# Patient Record
Sex: Male | Born: 1990 | Hispanic: No | Marital: Single | Smoking: Never smoker
Health system: Southern US, Community
[De-identification: ages and names within clinical notes are randomized; demographics above are authoritative.]

## PROBLEM LIST (undated history)

## (undated) DIAGNOSIS — J45909 Unspecified asthma, uncomplicated: Secondary | ICD-10-CM

---
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
## 2009-05-09 ENCOUNTER — Ambulatory Visit: Payer: Self-pay | Admitting: Physician Assistant

## 2009-05-09 DIAGNOSIS — F1010 Alcohol abuse, uncomplicated: Secondary | ICD-10-CM | POA: Insufficient documentation

## 2009-05-09 DIAGNOSIS — J45909 Unspecified asthma, uncomplicated: Secondary | ICD-10-CM | POA: Insufficient documentation

## 2009-05-09 DIAGNOSIS — S8000XA Contusion of unspecified knee, initial encounter: Secondary | ICD-10-CM | POA: Insufficient documentation

## 2009-05-09 DIAGNOSIS — J309 Allergic rhinitis, unspecified: Secondary | ICD-10-CM | POA: Insufficient documentation

## 2009-05-09 DIAGNOSIS — B354 Tinea corporis: Secondary | ICD-10-CM | POA: Insufficient documentation

## 2009-12-31 ENCOUNTER — Emergency Department (HOSPITAL_COMMUNITY)
Admission: EM | Admit: 2009-12-31 | Discharge: 2009-12-31 | Payer: Self-pay | Source: Home / Self Care | Admitting: Family Medicine
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
## 2010-10-01 LAB — HERPES SIMPLEX VIRUS CULTURE: Culture: NOT DETECTED

## 2013-04-08 ENCOUNTER — Emergency Department (HOSPITAL_COMMUNITY)
Admission: EM | Admit: 2013-04-08 | Discharge: 2013-04-08 | Disposition: A | Discharge status: Home / Self Care | Payer: Self-pay | Attending: Emergency Medicine | Admitting: Emergency Medicine

## 2013-04-08 ENCOUNTER — Encounter (HOSPITAL_COMMUNITY): Payer: Self-pay | Admitting: Emergency Medicine

## 2013-04-08 ENCOUNTER — Emergency Department (HOSPITAL_COMMUNITY): Payer: Self-pay

## 2013-04-08 DIAGNOSIS — S0083XA Contusion of other part of head, initial encounter: Secondary | ICD-10-CM

## 2013-04-08 DIAGNOSIS — F3289 Other specified depressive episodes: Secondary | ICD-10-CM | POA: Insufficient documentation

## 2013-04-08 DIAGNOSIS — F32A Depression, unspecified: Secondary | ICD-10-CM

## 2013-04-08 DIAGNOSIS — J45909 Unspecified asthma, uncomplicated: Secondary | ICD-10-CM | POA: Insufficient documentation

## 2013-04-08 DIAGNOSIS — F1010 Alcohol abuse, uncomplicated: Secondary | ICD-10-CM | POA: Insufficient documentation

## 2013-04-08 DIAGNOSIS — S60229A Contusion of unspecified hand, initial encounter: Secondary | ICD-10-CM | POA: Insufficient documentation

## 2013-04-08 DIAGNOSIS — S1093XA Contusion of unspecified part of neck, initial encounter: Secondary | ICD-10-CM

## 2013-04-08 DIAGNOSIS — F329 Major depressive disorder, single episode, unspecified: Secondary | ICD-10-CM | POA: Insufficient documentation

## 2013-04-08 DIAGNOSIS — S0003XA Contusion of scalp, initial encounter: Secondary | ICD-10-CM | POA: Insufficient documentation

## 2013-04-08 DIAGNOSIS — X838XXA Intentional self-harm by other specified means, initial encounter: Secondary | ICD-10-CM | POA: Insufficient documentation

## 2013-04-08 HISTORY — DX: Unspecified asthma, uncomplicated: J45.909

## 2013-04-08 LAB — COMPREHENSIVE METABOLIC PANEL
ALBUMIN: 4.5 g/dL (ref 3.5–5.2)
ALK PHOS: 92 U/L (ref 39–117)
ALT: 23 U/L (ref 0–53)
AST: 30 U/L (ref 0–37)
BUN: 9 mg/dL (ref 6–23)
CALCIUM: 8.7 mg/dL (ref 8.4–10.5)
CO2: 22 mEq/L (ref 19–32)
Chloride: 104 mEq/L (ref 96–112)
Creatinine, Ser: 0.71 mg/dL (ref 0.50–1.35)
GFR calc non Af Amer: >90 mL/min (ref >90)
GLUCOSE: 102 mg/dL — AB (ref 70–99)
POTASSIUM: 3.9 meq/L (ref 3.7–5.3)
SODIUM: 143 meq/L (ref 137–147)
TOTAL PROTEIN: 8.1 g/dL (ref 6.0–8.3)
Total Bilirubin: 0.4 mg/dL (ref 0.3–1.2)

## 2013-04-08 LAB — RAPID URINE DRUG SCREEN, HOSP PERFORMED
AMPHETAMINES: NOT DETECTED
BENZODIAZEPINES: NOT DETECTED
Barbiturates: NOT DETECTED
COCAINE: NOT DETECTED
OPIATES: NOT DETECTED
TETRAHYDROCANNABINOL: NOT DETECTED

## 2013-04-08 LAB — CBC WITH DIFFERENTIAL/PLATELET
BASOS PCT: 1 % (ref 0–1)
Basophils Absolute: 0 10*3/uL (ref 0.0–0.1)
EOS ABS: 0 10*3/uL (ref 0.0–0.7)
EOS PCT: 1 % (ref 0–5)
HCT: 44.3 % (ref 39.0–52.0)
Hemoglobin: 16.4 g/dL (ref 13.0–17.0)
Lymphocytes Relative: 51 % — ABNORMAL HIGH (ref 12–46)
Lymphs Abs: 1.5 10*3/uL (ref 0.7–4.0)
MCH: 33.1 pg (ref 26.0–34.0)
MCHC: 37 g/dL — AB (ref 30.0–36.0)
MCV: 89.5 fL (ref 78.0–100.0)
Monocytes Absolute: 0.2 10*3/uL (ref 0.1–1.0)
Monocytes Relative: 6 % (ref 3–12)
NEUTROS PCT: 41 % — AB (ref 43–77)
Neutro Abs: 1.2 10*3/uL — ABNORMAL LOW (ref 1.7–7.7)
PLATELETS: 276 10*3/uL (ref 150–400)
RBC: 4.95 MIL/uL (ref 4.22–5.81)
RDW: 11.8 % (ref 11.5–15.5)
WBC: 3 10*3/uL — ABNORMAL LOW (ref 4.0–10.5)

## 2013-04-08 LAB — ETHANOL: ALCOHOL ETHYL (B): 242 mg/dL — AB (ref 0–11)
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
## 2013-04-08 NOTE — ED Notes (Signed)
Pt tried to give urine sample and was unable to give same.  Will try again at later time.

## 2013-04-08 NOTE — Discharge Instructions (Signed)
Alcohol Intoxication Alcohol intoxication occurs when you drink enough alcohol that it affects your ability to function. It can be mild or very severe. Drinking a lot of alcohol in a short time is called binge drinking. This can be very harmful. Drinking alcohol can also be more dangerous if you are taking medicines or other drugs. Some of the effects caused by alcohol may include:  Loss of coordination.  Changes in mood and behavior.  Unclear thinking.  Trouble talking (slurred speech).  Throwing up (vomiting).  Confusion.  Slowed breathing.  Twitching and shaking (seizures).  Loss of consciousness. HOME CARE  Do not drive after drinking alcohol.  Drink enough water and fluids to keep your pee (urine) clear or pale yellow. Avoid caffeine.  Only take medicine as told by your doctor. GET HELP IF:  You throw up (vomit) many times.  You do not feel better after a few days.  You frequently have alcohol intoxication. Your doctor can help decide if you should see a substance use treatment counselor. GET HELP RIGHT AWAY IF:  You become shaky when you stop drinking.  You have twitching and shaking.  You throw up blood. It may look bright red or like coffee grounds.  You notice blood in your poop (bowel movements).  You become lightheaded or pass out (faint). MAKE SURE YOU:   Understand these instructions.  Will watch your condition.  Will get help right away if you are not doing well or get worse. Document Released: 06/09/2007 Document Revised: 08/23/2012 Document Reviewed: 05/26/2012 [facility] Patient Information 2014 [place].  Alcohol Problems Most adults who drink alcohol drink in moderation (not a lot) are at low risk for developing problems related to their drinking. However, all drinkers, including low-risk drinkers, should know about the health risks connected with drinking alcohol. RECOMMENDATIONS FOR LOW-RISK DRINKING  Drink in moderation. Moderate  drinking is defined as follows:   Men - no more than 2 drinks per day.  Nonpregnant women - no more than 1 drink per day.  Over age 53 - no more than 1 drink per day. A standard drink is 12 grams of pure alcohol, which is equal to a 12 ounce bottle of beer or wine cooler, a 5 ounce glass of wine, or 1.5 ounces of distilled spirits (such as whiskey, brandy, vodka, or rum).  ABSTAIN FROM (DO NOT DRINK) ALCOHOL:  When pregnant or considering pregnancy.  When taking a medication that interacts with alcohol.  If you are alcohol dependent.  A medical condition that prohibits drinking alcohol (such as ulcer, liver disease, or heart disease). DISCUSS WITH YOUR CAREGIVER:  If you are at risk for coronary heart disease, discuss the potential benefits and risks of alcohol use: Light to moderate drinking is associated with lower rates of coronary heart disease in certain populations (for example, men over age 61 and postmenopausal women). Infrequent or nondrinkers are advised not to begin light to moderate drinking to reduce the risk of coronary heart disease so as to avoid creating an alcohol-related problem. Similar protective effects can likely be gained through proper diet and exercise.  Women and the elderly have smaller amounts of body water than men. As a result women and the elderly achieve a higher blood alcohol concentration after drinking the same amount of alcohol.  Exposing a fetus to alcohol can cause a broad range of birth defects referred to as Fetal Alcohol Syndrome (FAS) or Alcohol-Related Birth Defects (ARBD). Although FAS/ARBD is connected with excessive alcohol consumption  during pregnancy, studies also have reported neurobehavioral problems in infants born to mothers reporting drinking an average of 1 drink per day during pregnancy.  Heavier drinking (the consumption of more than 4 drinks per occasion by men and more than 3 drinks per occasion by women) impairs learning (cognitive)  and psychomotor functions and increases the risk of alcohol-related problems, including accidents and injuries. CAGE QUESTIONS:   Have you ever felt that you should Cut down on your drinking?  Have people Annoyed you by criticizing your drinking?  Have you ever felt bad or Guilty about your drinking?  Have you ever had a drink first thing in the morning to steady your nerves or get rid of a hangover (Eye opener)? If you answered positively to any of these questions: You may be at risk for alcohol-related problems if alcohol consumption is:   Men: Greater than 14 drinks per week or more than 4 drinks per occasion.  Women: Greater than 7 drinks per week or more than 3 drinks per occasion. Do you or your family have a medical history of alcohol-related problems, such as:  Blackouts.  Sexual dysfunction.  Depression.  Trauma.  Liver dysfunction.  Sleep disorders.  Hypertension.  Chronic abdominal pain.  Has your drinking ever caused you problems, such as problems with your family, problems with your work (or school) performance, or accidents/injuries?  Do you have a compulsion to drink or a preoccupation with drinking?  Do you have poor control or are you unable to stop drinking once you have started?  Do you have to drink to avoid withdrawal symptoms?  Do you have problems with withdrawal such as tremors, nausea, sweats, or mood disturbances?  Does it take more alcohol than in the past to get you high?  Do you feel a strong urge to drink?  Do you change your plans so that you can have a drink?  Do you ever drink in the morning to relieve the shakes or a hangover? If you have answered a number of the previous questions positively, it may be time for you to talk to your caregivers, family, and friends and see if they think you have a problem. Alcoholism is a chemical dependency that keeps getting worse and will eventually destroy your health and relationships. Many  alcoholics end up dead, impoverished, or in prison. This is often the end result of all chemical dependency.  Do not be discouraged if you are not ready to take action immediately.  Decisions to change behavior often involve up and down desires to change and feeling like you cannot decide.  Try to think more seriously about your drinking behavior.  Think of the reasons to quit. WHERE TO GO FOR ADDITIONAL INFORMATION   The National Institute on Alcohol Abuse and Alcoholism (NIAAA) BasicStudents.dkwww.niaaa.nih.gov  [employer] Council on Alcoholism and Drug Dependence (NCADD) www.ncadd.org  American Society of Addiction Medicine (ASAM) RoyalDiary.glwww.asam.org  Document Released: 12/21/2004 Document Revised: 03/15/2011 Document Reviewed: 08/09/2007 [facility] Patient Information 2014 [place]. You have signs of possible anxiety and/or depression. This is a very common problem.  Be sure to call your caregiver and arrange for follow-up care as suggested by our staff. RETURN IMMEDIATELY IF DEVELOP threat to harm self or others, suicidal or homicidal thoughts, hallucinations or confusion, unable to be cared for at home or uncontrolled behavior, or other concerns.  Emergency Department Resource Guide 1) Find a Doctor and Pay Out of Pocket Although you won't have to find out who is covered by  your insurance plan, it is a good idea to ask around and get recommendations. You will then need to call the office and see if the doctor you have chosen will accept you as a new patient and what types of options they offer for patients who are self-pay. Some doctors offer discounts or will set up payment plans for their patients who do not have insurance, but you will need to ask so you aren't surprised when you get to your appointment.  2) Contact Your Local Health Department Not all health departments have doctors that can see patients for sick visits, but many do, so it is worth a call to see if yours does. If you don't know  where your local health department is, you can check in your phone book. The CDC also has a tool to help you locate your state's health department, and many state websites also have listings of all of their local health departments.  3) Find a Walk-in Clinic If your illness is not likely to be very severe or complicated, you may want to try a walk in clinic. These are popping up all over the country in pharmacies, drugstores, and shopping centers. They're usually staffed by nurse practitioners or physician assistants that have been trained to treat common illnesses and complaints. They're usually fairly quick and inexpensive. However, if you have serious medical issues or chronic medical problems, these are probably not your best option.  No Primary Care Doctor: - Call Health Connect at  [phone] - they can help you locate a primary care doctor that  accepts your insurance, provides certain services, etc. - Physician Referral Service- [phone]  Chronic Pain Problems: Organization         Address  Phone   Notes  [facility]  [phone] Patients need to be referred by their primary care doctor.   Medication Assistance: Organization         Address  Phone   Notes  [facility] Medication [facility] [address] [place] [place], [place] [address] [phone] --Must be a resident of [facility] -- Must have NO insurance coverage whatsoever (no Medicaid/ Medicare, etc.) -- The pt. MUST have a primary care doctor that directs their care regularly and follows them in the community   [facility]  [phone]   [employer]  [phone]    Agencies that provide inexpensive medical care: Organization         Address  Phone   Notes  [facility]  [phone]   [facility]    [phone]   [facility] [address] [place], [place] [address] [phone]   [facility]  [address] [place], [place] [phone]   Planned Parenthood    [phone]   [facility]    [phone]   [facility]  [address], [place] Phone:  [phone], Fax:  [phone] Hours of Operation:  9 am - 6 pm, M-F.  Also accepts Medicaid/Medicare and self-pay.  [facility]  [address], [place] Phone: [phone], Fax: [phone]. Hours of Operation:  8:30 am - 5:30 pm, M-F.  Also accepts Medicaid and self-pay.  [facility] [place] [address] [place] Phone: [phone]   [facility] [address] Natasha Bence [place], [place] [phone], Ext. [phone] Mondays &  Thursdays: 7-9 AM.  First 15 patients are seen on a first come, first serve basis.    Medicaid-accepting [facility] Providers:  Organization         Address  Phone   Notes  [facility] [address], [place] [phone] Also accepts self-pay patients.  [facility] [address] Laurell Josephs [place], [place]  [phone]   [facility] [address], [place] [phone]   [facility] [address] [place] [phone]   Renaye Rakers [address], [place]   [phone] Only accepts [place] Access [place] patients after they have their name applied to their card.   Self-Pay (no insurance) in [facility]:  Organization         Address  Phone   Notes  Sickle Cell Patients, [facility] [address] [place], [place] [phone]   [facility] [address] [place], [place] [phone]   [facility] [place]  [address], [place] [phone]   [facility]/Dr. Osei-Bonsu  [address], [place] or [address], [place] [phone] Phone number for both  [place] and [place] locations is the same.  [facility] [address], [place] [phone]   [facility] [address], [place] or [address] [phone]   [facility] [address], [place] [phone], phone; [phone], fax Sees patients 1st and 3rd Saturday of every month.  Must not qualify for public or private insurance (i.e. Medicaid, Medicare, [place] Health Choice, Veterans' Benefits)  Household income should be no more than 200% of the poverty level The clinic cannot treat you if you are pregnant or think you are pregnant  Sexually transmitted diseases are not treated at the clinic.   Dental Care: Organization         Address  Phone  Notes  [facility] Department of [facility] [address] [place], [place] [phone] Accepts children up to age 20 who are enrolled in [place] or [place] Health Choice; pregnant women with a Medicaid card; and children who have applied for Medicaid or [place] Health Choice, but were declined, whose parents can pay a reduced fee at time of service.  [facility] Department of [facility]  [address], [place] [phone] Accepts children up to age 34 who are enrolled in [place] or [place] Health Choice; pregnant women with a Medicaid card; and children who have applied for Medicaid or [place] Health Choice, but were declined, whose parents can pay a reduced fee at time of service.  [facility]  [address] [place], [place] [phone] Patients are seen by appointment only. Walk-ins are not accepted. [facility] will see patients 48 years of age and older. Monday - Tuesday (8am-5pm) Most Wednesdays (8:30-5pm) $30 per visit, cash only  [facility] Adult Dental Access [facility]  [address], [facility] [phone] Patients are seen by appointment only. Walk-ins are not  accepted. [facility] will see patients 11 years of age and older. One Wednesday Evening (Monthly: Volunteer Based).  $30 per visit, cash only  [employer]  [phone] for adults; Children under age  4, call Graduate Pediatric Dentistry at [phone]. Children aged 89-14, please call [phone] to request a pediatric application.  Dental services are provided in all areas of dental care including fillings, crowns and bridges, complete and partial dentures, implants, gum treatment, root canals, and extractions. Preventive care is also provided. Treatment is provided to both adults and children. Patients are selected via a lottery and there is often a waiting list.   [facility] [address] [place]  [phone] www.[email or web address]   [facility] [address] [place], [place] [phone], Ext. [phone] Second and Fourth Thursday of each month, opens at 6:30 AM; Clinic ends at 9 AM.  Patients are seen on a first-come first-served basis, and a limited number are seen during each clinic.   [facility]  [address] Ether Griffins [place], [place] [phone]   Eligibility Requirements You must have lived in [place], [place], or [place] counties for at least the last three months.   You cannot be eligible for state or federal sponsored [place], including [employer], [place], or [employer].   You generally cannot be eligible for healthcare insurance through your employer.    How to apply: Eligibility screenings are held every Tuesday and Wednesday afternoon from 1:00 pm until 4:00 pm. You do not need an appointment for the interview!  [facility] [address] [place], [place] [phone]   [facility] Health Department  [phone]   [facility] Health Department  [phone]   [facility] Health Department  [phone]    Behavioral Health Resources in the  Community: Intensive Outpatient Programs Organization         Address  Phone  Notes  [facility] Services [address] [place], [place] [phone]   [facility] Outpatient [address], [place], [place] [phone]   [facility] [address], [place], [place]  [phone]   [facility] [address]  [place], [place] [phone] or [phone]   Substance Abuse Resources Organization         Address  Phone  Notes  [facility]  [phone]   [facility]  [phone]   [facility] [place]  [phone]   Floydene Flock  [phone]   Residential & Outpatient Substance Abuse Program  [phone]   Psychological Services Organization         Address  Phone  Notes  [facility] Behavioral Health  [phone]   [facility] Services  [phone]   [facility] Mental Health [address] [place] [phone] or [phone]    Mobile Crisis Teams Organization         Address  Phone  Notes  [facility], Mobile Crisis Care Unit  [phone]   [facility]  [address] [place], [place] [phone]   Doristine Locks [address] [place] [phone]    Self-Help/Support Groups Organization         Address  Phone             Notes  [facility] [place] - variety of support groups  [phone]- [phone] Call for more information  Narcotics Anonymous (NA), [facility] [address], [employer] [place]  2 meetings at this location   Statistician         Address  Phone  Notes  [facility] [address],  [place]  [phone]   [facility]  [address] [place] [address], [place], [place] [phone]   [facility] [address] [place], [place] [phone] Admissions: 8am-3pm M-F  [facility] [address]    [place], [place] [phone]   [facility] [address] [place], [place], [place] [phone]   [facility] [address]  [place], [place] [phone]   [facility] - Intensive Outpatient [address] Laurell Josephs [address], [place], [place] [phone]   [facility] [address] [place].,  [place], [place] [phone] or [phone]   [facility] ([facility]) [address], [place], [place] [phone] Accepts Medicaid  [facility] [address],  [place] [phone] Substance Abuse/Addiction Treatment   [facility] Organization         Address  Phone  Notes  [facility]  [phone]   Angie Fava, PhD [address] Ervin Knack [place], [place]   [phone] or [phone]   [facility] Behavioral   [address] [place], [place] [phone]   [facility] [address], [place], [place] [phone] Insurance/Medicaid/sponsorship through [facility] and Families [address]                                    [place], [place] [phone] Therapy/tele-psych/case  [facility] [address], [place] [phone]    Dr. Lolly Mustache  [phone]   [facility]  [facility] Dept. 1) [address] [place] 2) [address] [place] 3)  [address], [place] [phone]  [phone]   [facility] Child Abuse Hotline [phone] or 6827513752 (After Hours)

## 2013-04-08 NOTE — ED Provider Notes (Signed)
CSN: [id number]     Arrival date & time 04/08/13  0554 History   First MD Initiated Contact with Patient 04/08/13 0710     Chief Complaint  Patient presents with  . Suicidal     (Consider location/radiation/quality/duration/timing/severity/associated sxs/prior Treatment) HPI Comments: 23 year old male, history of alcohol use, states he has had some mild suicidal thoughts for the last 4 or 5 years. He states that when he feels locked into a situation that he can't get out of the suicidal thoughts get worse. This evening he was caught drug driving by the police officers who pulled him over in his vehicle. They took him to jail and when he was behind bars he became very upset and started to get the walls and windows with his fists. He started banging his head against the window and started to say he was suicidal which is why they brought him to the hospital. The patient states he is not actively suicidal at this time because he has not locked up. He does endorse tricking alcohol this evening and complains of pain to the bilateral hands.  The history is provided by the patient and the police.    Past Medical History  Diagnosis Date  . Asthma    History reviewed. No pertinent past surgical history. No family history on file. History  Substance Use Topics  . Smoking status: Never Smoker   . Smokeless tobacco: Not on file  . Alcohol Use: Yes    Review of Systems  All other systems reviewed and are negative.      Allergies  Review of patient's allergies indicates no known allergies.  Home Medications  No current outpatient prescriptions on file. BP 120/74  Pulse 88  Temp(Src) 97 F (36.1 C) (Oral)  Resp 17  SpO2 100% Physical Exam  Nursing note and vitals reviewed. Constitutional: He appears well-developed and well-nourished. No distress.  HENT:  Head: Normocephalic.  Mouth/Throat: Oropharynx is clear and moist. No oropharyngeal exudate.  Small hematoma to upper forehead,  no laceration, no raccoon eyes, no battle sign  Eyes: Conjunctivae and EOM are normal. Pupils are equal, round, and reactive to light. Right eye exhibits no discharge. Left eye exhibits no discharge. No scleral icterus.  Neck: Normal range of motion. Neck supple. No JVD present. No thyromegaly present.  Cardiovascular: Normal rate, regular rhythm, normal heart sounds and intact distal pulses.  Exam reveals no gallop and no friction rub.   No murmur heard. Pulmonary/Chest: Effort normal and breath sounds normal. No respiratory distress. He has no wheezes. He has no rales.  Abdominal: Soft. Bowel sounds are normal. He exhibits no distension and no mass. There is no tenderness.  Musculoskeletal: Normal range of motion. He exhibits tenderness ( Tenderness to palpation over the left metacarpophalangeal joint, normal range of motion, no deformity, no swelling, abrasions over the knuckles present.). He exhibits no edema.  Tenderness also located over the right hand over the fifth metacarpal as well as the second digit proximal phalanx. Again no dislocations or deformities, . No breaks in the skin.  Lymphadenopathy:    He has no cervical adenopathy.  Neurological: He is alert. Coordination normal.  Speech is clear, gait is steady, movements are coordinated  Skin: Skin is warm and dry.  Abrasions as noted  Psychiatric: He has a normal mood and affect. His behavior is normal.    ED Course  Procedures (including critical care time) Labs Review Labs Reviewed  ETHANOL - Abnormal; Notable for the following:  Alcohol, Ethyl (B) 242 (*)    All other components within normal limits  CBC WITH DIFFERENTIAL - Abnormal; Notable for the following:    WBC 3.0 (*)    MCHC 37.0 (*)    Neutrophils Relative % 41 (*)    Neutro Abs 1.2 (*)    Lymphocytes Relative 51 (*)    All other components within normal limits  COMPREHENSIVE METABOLIC PANEL - Abnormal; Notable for the following:    Glucose, Bld 102 (*)     All other components within normal limits  URINE RAPID DRUG SCREEN (HOSP PERFORMED)   Imaging Review Dg Hand Complete Left  04/08/2013   CLINICAL DATA:  Pain, injury  EXAM: LEFT HAND - COMPLETE 3+ VIEW  COMPARISON:  None.  FINDINGS: There is no evidence of fracture or dislocation. There is no evidence of arthropathy or other focal bone abnormality. Soft tissues are unremarkable.  IMPRESSION: No acute osseous finding   Electronically Signed   By: Ruel Favorsrevor  Shick M.D.   On: 04/08/2013 08:13   Dg Hand Complete Right  04/08/2013   CLINICAL DATA:  Pain, injury  EXAM: RIGHT HAND - COMPLETE 3+ VIEW  COMPARISON:  None.  FINDINGS: There is no evidence of fracture or dislocation. There is no evidence of arthropathy or other focal bone abnormality. Soft tissues are unremarkable.  IMPRESSION: No acute osseous finding   Electronically Signed   By: Ruel Favorsrevor  Shick M.D.   On: 04/08/2013 08:11     MDM   Final diagnoses:  Contusion of hand(s)  Alcohol abuse  Chronic depression    According to the police officer they have the ability to put him on suicide watch at jail. He has not had suicidal at this time, it seems situational. We'll rule out injury, but can be d/c in care of police.    Imaging neg, may be d/c to jail.    Vida RollerBrian D Alvita Fana, MD 04/09/13 [phone]

## 2013-04-08 NOTE — ED Notes (Signed)
Patient transported to X-ray 

## 2013-04-08 NOTE — ED Notes (Signed)
Paper scrubs given to pt. To wear , personal belongings placed in plastic bag /labelled. Security notified to wand pt.

## 2014-05-22 ENCOUNTER — Encounter (HOSPITAL_COMMUNITY): Payer: Self-pay | Admitting: *Deleted

## 2014-05-22 ENCOUNTER — Emergency Department (HOSPITAL_COMMUNITY)
Admission: EM | Admit: 2014-05-22 | Discharge: 2014-05-22 | Disposition: A | Discharge status: Home / Self Care | Payer: BLUE CROSS/BLUE SHIELD | Source: Home / Self Care | Attending: Family Medicine | Admitting: Family Medicine

## 2014-05-22 DIAGNOSIS — K529 Noninfective gastroenteritis and colitis, unspecified: Secondary | ICD-10-CM | POA: Diagnosis not present

## 2014-05-22 DIAGNOSIS — E860 Dehydration: Secondary | ICD-10-CM | POA: Diagnosis not present

## 2014-05-22 DIAGNOSIS — R197 Diarrhea, unspecified: Secondary | ICD-10-CM | POA: Diagnosis not present

## 2014-05-22 NOTE — ED Notes (Signed)
Pt  Reports symptoms of diarrhea    With  Nausea   No vomiting  With  abd  Pain as  Well    Symptoms  X   sev  Days   - pt  Appears  In no severe  Distress   Speaking in  Complete  sentances

## 2014-05-22 NOTE — Discharge Instructions (Signed)
Diarrhea One Immodium AD today. If continues to have over 3-4 stools tomorrow may take one more. Drink Pedialyte, the best. May also continue Gatorade. Read diet below. Diarrhea is frequent loose and watery bowel movements. It can cause you to feel weak and dehydrated. Dehydration can cause you to become tired and thirsty, have a dry mouth, and have decreased urination that often is dark yellow. Diarrhea is a sign of another problem, most often an infection that will not last long. In most cases, diarrhea typically lasts 2-3 days. However, it can last longer if it is a sign of something more serious. It is important to treat your diarrhea as directed by your caregiver to lessen or prevent future episodes of diarrhea. CAUSES  Some common causes include:  Gastrointestinal infections caused by viruses, bacteria, or parasites.  Food poisoning or food allergies.  Certain medicines, such as antibiotics, chemotherapy, and laxatives.  Artificial sweeteners and fructose.  Digestive disorders. HOME CARE INSTRUCTIONS  Ensure adequate fluid intake (hydration): Have 1 cup (8 oz) of fluid for each diarrhea episode. Avoid fluids that contain simple sugars or sports drinks, fruit juices, whole milk products, and sodas. Your urine should be clear or pale yellow if you are drinking enough fluids. Hydrate with an oral rehydration solution that you can purchase at pharmacies, retail stores, and online. You can prepare an oral rehydration solution at home by mixing the following ingredients together:   - tsp table salt.   tsp baking soda.   tsp salt substitute containing potassium chloride.  1  tablespoons sugar.  1 L (34 oz) of water.  Certain foods and beverages may increase the speed at which food moves through the gastrointestinal (GI) tract. These foods and beverages should be avoided and include:  Caffeinated and alcoholic beverages.  High-fiber foods, such as raw fruits and vegetables, nuts,  seeds, and whole grain breads and cereals.  Foods and beverages sweetened with sugar alcohols, such as xylitol, sorbitol, and mannitol.  Some foods may be well tolerated and may help thicken stool including:  Starchy foods, such as rice, toast, pasta, low-sugar cereal, oatmeal, grits, baked potatoes, crackers, and bagels.  Bananas.  Applesauce.  Add probiotic-rich foods to help increase healthy bacteria in the GI tract, such as yogurt and fermented milk products.  Wash your hands well after each diarrhea episode.  Only take over-the-counter or prescription medicines as directed by your caregiver.  Take a warm bath to relieve any burning or pain from frequent diarrhea episodes. SEEK IMMEDIATE MEDICAL CARE IF:   You are unable to keep fluids down.  You have persistent vomiting.  You have blood in your stool, or your stools are black and tarry.  You do not urinate in 6-8 hours, or there is only a small amount of very dark urine.  You have abdominal pain that increases or localizes.  You have weakness, dizziness, confusion, or light-headedness.  You have a severe headache.  Your diarrhea gets worse or does not get better.  You have a fever or persistent symptoms for more than 2-3 days.  You have a fever and your symptoms suddenly get worse. MAKE SURE YOU:   Understand these instructions.  Will watch your condition.  Will get help right away if you are not doing well or get worse. Document Released: 12/11/2001 Document Revised: 05/07/2013 Document Reviewed: 08/29/2011 [facility] Patient Information 2015 [place], [place]. This information is not intended to replace advice given to you by your health care provider. Make sure you  discuss any questions you have with your health care provider.  Food Choices to Help Relieve Diarrhea When you have diarrhea, the foods you eat and your eating habits are very important. Choosing the right foods and drinks can help relieve diarrhea.  Also, because diarrhea can last up to 7 days, you need to replace lost fluids and electrolytes (such as sodium, potassium, and chloride) in order to help prevent dehydration.  WHAT GENERAL GUIDELINES DO I NEED TO FOLLOW?  Slowly drink 1 cup (8 oz) of fluid for each episode of diarrhea. If you are getting enough fluid, your urine will be clear or pale yellow.  Eat starchy foods. Some good choices include white rice, white toast, pasta, low-fiber cereal, baked potatoes (without the skin), saltine crackers, and bagels.  Avoid large servings of any cooked vegetables.  Limit fruit to two servings per day. A serving is  cup or 1 small piece.  Choose foods with less than 2 g of fiber per serving.  Limit fats to less than 8 tsp (38 g) per day.  Avoid fried foods.  Eat foods that have probiotics in them. Probiotics can be found in certain dairy products.  Avoid foods and beverages that may increase the speed at which food moves through the stomach and intestines (gastrointestinal tract). Things to avoid include:  High-fiber foods, such as dried fruit, raw fruits and vegetables, nuts, seeds, and whole grain foods.  Spicy foods and high-fat foods.  Foods and beverages sweetened with high-fructose corn syrup, honey, or sugar alcohols such as xylitol, sorbitol, and mannitol. WHAT FOODS ARE RECOMMENDED? Grains White rice. White, [place], or pita breads (fresh or toasted), including plain rolls, buns, or bagels. White pasta. Saltine, soda, or graham crackers. Pretzels. Low-fiber cereal. Cooked cereals made with water (such as cornmeal, farina, or cream cereals). Plain muffins. Matzo. Melba toast. Zwieback.  Vegetables Potatoes (without the skin). Strained tomato and vegetable juices. Most well-cooked and canned vegetables without seeds. Tender lettuce. Fruits Cooked or canned applesauce, apricots, cherries, fruit cocktail, grapefruit, peaches, pears, or plums. Fresh bananas, apples without skin,  cherries, grapes, cantaloupe, grapefruit, peaches, oranges, or plums.  Meat and Other Protein Products Baked or boiled chicken. Eggs. Tofu. Fish. Seafood. Smooth peanut butter. Ground or well-cooked tender beef, ham, veal, lamb, pork, or poultry.  Dairy Plain yogurt, kefir, and unsweetened liquid yogurt. Lactose-free milk, buttermilk, or soy milk. Plain hard cheese. Beverages Sport drinks. Clear broths. Diluted fruit juices (except prune). Regular, caffeine-free sodas such as ginger ale. Water. Decaffeinated teas. Oral rehydration solutions. Sugar-free beverages not sweetened with sugar alcohols. Other Bouillon, broth, or soups made from recommended foods.  The items listed above may not be a complete list of recommended foods or beverages. Contact your dietitian for more options. WHAT FOODS ARE NOT RECOMMENDED? Grains Whole grain, whole wheat, bran, or rye breads, rolls, pastas, crackers, and cereals. Wild or brown rice. Cereals that contain more than 2 g of fiber per serving. Corn tortillas or taco shells. Cooked or dry oatmeal. Granola. Popcorn. Vegetables Raw vegetables. Cabbage, broccoli, Brussels sprouts, artichokes, baked beans, beet greens, corn, kale, legumes, peas, sweet potatoes, and yams. Potato skins. Cooked spinach and cabbage. Fruits Dried fruit, including raisins and dates. Raw fruits. Stewed or dried prunes. Fresh apples with skin, apricots, mangoes, pears, raspberries, and strawberries.  Meat and Other Protein Products Chunky peanut butter. Nuts and seeds. Beans and lentils. Tomasa Blase.  Dairy High-fat cheeses. Milk, chocolate milk, and beverages made with milk, such as milk shakes. Cream.  Ice cream. Sweets and Desserts Sweet rolls, doughnuts, and sweet breads. Pancakes and waffles. Fats and Oils Butter. Cream sauces. Margarine. Salad oils. Plain salad dressings. Olives. Avocados.  Beverages Caffeinated beverages (such as coffee, tea, soda, or energy drinks). Alcoholic  beverages. Fruit juices with pulp. Prune juice. Soft drinks sweetened with high-fructose corn syrup or sugar alcohols. Other Coconut. Hot sauce. Chili powder. Mayonnaise. Gravy. Cream-based or milk-based soups.  The items listed above may not be a complete list of foods and beverages to avoid. Contact your dietitian for more information. WHAT SHOULD I DO IF I BECOME DEHYDRATED? Diarrhea can sometimes lead to dehydration. Signs of dehydration include dark urine and dry mouth and skin. If you think you are dehydrated, you should rehydrate with an oral rehydration solution. These solutions can be purchased at pharmacies, retail stores, or online.  Drink -1 cup (120-240 mL) of oral rehydration solution each time you have an episode of diarrhea. If drinking this amount makes your diarrhea worse, try drinking smaller amounts more often. For example, drink 1-3 tsp (5-15 mL) every 5-10 minutes.  A general rule for staying hydrated is to drink 1-2 L of fluid per day. Talk to your health care provider about the specific amount you should be drinking each day. Drink enough fluids to keep your urine clear or pale yellow. Document Released: 03/13/2003 Document Revised: 12/26/2012 Document Reviewed: 11/13/2012 [facility] Patient Information 2015 [place], [place]. This information is not intended to replace advice given to you by your health care provider. Make sure you discuss any questions you have with your health care provider.  Viral Gastroenteritis Viral gastroenteritis is also called stomach flu. This illness is caused by a certain type of germ (virus). It can cause sudden watery poop (diarrhea) and throwing up (vomiting). This can cause you to lose body fluids (dehydration). This illness usually lasts for 3 to 8 days. It usually goes away on its own. HOME CARE   Drink enough fluids to keep your pee (urine) clear or pale yellow. Drink small amounts of fluids often.  Ask your doctor how to replace body fluid  losses (rehydration).  Avoid:  Foods high in sugar.  Alcohol.  Bubbly (carbonated) drinks.  Tobacco.  Juice.  Caffeine drinks.  Very hot or cold fluids.  Fatty, greasy foods.  Eating too much at one time.  Dairy products until 24 to 48 hours after your watery poop stops.  You may eat foods with active cultures (probiotics). They can be found in some yogurts and supplements.  Wash your hands well to avoid spreading the illness.  Only take medicines as told by your doctor. Do not give aspirin to children. Do not take medicines for watery poop (antidiarrheals).  Ask your doctor if you should keep taking your regular medicines.  Keep all doctor visits as told. GET HELP RIGHT AWAY IF:   You cannot keep fluids down.  You do not pee at least once every 6 to 8 hours.  You are short of breath.  You see blood in your poop or throw up. This may look like coffee grounds.  You have belly (abdominal) pain that gets worse or is just in one small spot (localized).  You keep throwing up or having watery poop.  You have a fever.  The patient is a child younger than 3 months, and he or she has a fever.  The patient is a child older than 3 months, and he or she has a fever and problems that do  not go away.  The patient is a child older than 3 months, and he or she has a fever and problems that suddenly get worse.  The patient is a baby, and he or she has no tears when crying. MAKE SURE YOU:   Understand these instructions.  Will watch your condition.  Will get help right away if you are not doing well or get worse. Document Released: 06/09/2007 Document Revised: 03/15/2011 Document Reviewed: 10/07/2010 [facility] Patient Information 2015 [place], [place]. This information is not intended to replace advice given to you by your health care provider. Make sure you discuss any questions you have with your health care provider.

## 2014-05-22 NOTE — ED Provider Notes (Signed)
CSN: [id number]     Arrival date & time 05/22/14  1513 History   First MD Initiated Contact with Patient 05/22/14 1528     Chief Complaint  Patient presents with  . Diarrhea   (Consider location/radiation/quality/duration/timing/severity/associated sxs/prior Treatment) HPI Comments: 24 year old male complaining of diarrhea for 4 days. It is associated with occasional dizziness, mid lower abdominal pain described as cramping. He has taken Pepto-Bismol without relief. Denies nausea, vomiting, epigastric pain or use of alcohol. St has about 4 watery stools/d. No bleeding seen.   Past Medical History  Diagnosis Date  . Asthma    History reviewed. No pertinent past surgical history. History reviewed. No pertinent family history. History  Substance Use Topics  . Smoking status: Never Smoker   . Smokeless tobacco: Not on file  . Alcohol Use: Yes    Review of Systems  Constitutional: Positive for activity change and appetite change. Negative for fever and fatigue.  Respiratory: Negative.   Cardiovascular: Negative.   Gastrointestinal: Positive for abdominal pain and diarrhea. Negative for nausea, vomiting, constipation, blood in stool and abdominal distention.  Genitourinary: Negative.   Musculoskeletal: Negative.   Neurological: Negative.     Allergies  Review of patient's allergies indicates no known allergies.  Home Medications   Prior to Admission medications   Not on File   Temp(Src) 99.3 F (37.4 C) (Oral)  Resp 12  SpO2 100% Physical Exam  Constitutional: He is oriented to person, place, and time. He appears well-developed and well-nourished. No distress.  HENT:  Mouth/Throat: Oropharynx is clear and moist.  Eyes: Conjunctivae and EOM are normal.  Neck: Normal range of motion. Neck supple.  Cardiovascular: Normal rate, regular rhythm and normal heart sounds.   Pulmonary/Chest: Effort normal and breath sounds normal. No respiratory distress. He has no wheezes. He  has no rales.  Abdominal: Soft. Bowel sounds are normal. He exhibits no distension and no mass. There is no rebound and no guarding.  Mild tenderness midline abdomen just inferior to the umbilicus. No other areas of tenderness.  Musculoskeletal: He exhibits no edema.  Neurological: He is alert and oriented to person, place, and time.  Skin: Skin is warm and dry.  Nursing note and vitals reviewed.   ED Course  Procedures (including critical care time) Labs Review Labs Reviewed - No data to display  Imaging Review No results found.   MDM   1. Enteritis   2. Diarrhea     One Immodium AD today. If continues to have over 3-4 stools tomorrow may take one more. Drink Pedialyte, the best. May also continue Gatorade. Read diet below.    Hayden Rasmussenavid Lavere Stork, NP 05/22/14 1600

## 2014-07-30 ENCOUNTER — Encounter (HOSPITAL_COMMUNITY): Payer: Self-pay | Admitting: Emergency Medicine

## 2014-07-30 ENCOUNTER — Emergency Department (HOSPITAL_COMMUNITY)
Admission: EM | Admit: 2014-07-30 | Discharge: 2014-07-30 | Disposition: A | Discharge status: Home / Self Care | Payer: BLUE CROSS/BLUE SHIELD | Source: Home / Self Care | Attending: Emergency Medicine | Admitting: Emergency Medicine

## 2014-07-30 DIAGNOSIS — R42 Dizziness and giddiness: Secondary | ICD-10-CM

## 2014-07-30 DIAGNOSIS — L259 Unspecified contact dermatitis, unspecified cause: Secondary | ICD-10-CM | POA: Diagnosis not present

## 2014-07-30 MED ORDER — PREDNISONE 20 MG PO TABS: ORAL_TABLET | ORAL | Status: AC

## 2014-07-30 MED ORDER — TRIAMCINOLONE ACETONIDE 0.1 % EX CREA: 1.0000 "application " | TOPICAL_CREAM | Freq: Two times a day (BID) | CUTANEOUS | Status: AC

## 2014-07-30 MED ORDER — MECLIZINE HCL 25 MG PO TABS: ORAL_TABLET | ORAL | Status: AC

## 2014-07-30 NOTE — Discharge Instructions (Signed)
Dizziness  Dizziness means you feel unsteady or lightheaded. You might feel like you are going to pass out (faint). HOME CARE   Drink enough fluids to keep your pee (urine) clear or pale yellow.  Take your medicines exactly as told by your doctor. If you take blood pressure medicine, always stand up slowly from the lying or sitting position. Hold on to something to steady yourself.  If you need to stand in one place for a long time, move your legs often. Tighten and relax your leg muscles.  Have someone stay with you until you feel okay.  Do not drive or use heavy machinery if you feel dizzy.  Do not drink alcohol. GET HELP RIGHT AWAY IF:   You feel dizzy or lightheaded and it gets worse.  You feel sick to your stomach (nauseous), or you throw up (vomit).  You have trouble talking or walking.  You feel weak or have trouble using your arms, hands, or legs.  You cannot think clearly or have trouble forming sentences.  You have chest pain, belly (abdominal) pain, sweating, or you are short of breath.  Your vision changes.  You are bleeding.  You have problems from your medicine that seem to be getting worse. MAKE SURE YOU:   Understand these instructions.  Will watch your condition.  Will get help right away if you are not doing well or get worse. Document Released: 12/10/2010 Document Revised: 03/15/2011 Document Reviewed: 12/10/2010 [facility] Patient Information 2015 [place], [place]. This information is not intended to replace advice given to you by your health care provider. Make sure you discuss any questions you have with your health care provider.  Contact Dermatitis Contact dermatitis is a reaction to certain substances that touch the skin. Contact dermatitis can be either irritant contact dermatitis or allergic contact dermatitis. Irritant contact dermatitis does not require previous exposure to the substance for a reaction to occur.Allergic contact dermatitis only  occurs if you have been exposed to the substance before. Upon a repeat exposure, your body reacts to the substance.  CAUSES  Many substances can cause contact dermatitis. Irritant dermatitis is most commonly caused by repeated exposure to mildly irritating substances, such as:  Makeup.  Soaps.  Detergents.  Bleaches.  Acids.  Metal salts, such as nickel. Allergic contact dermatitis is most commonly caused by exposure to:  Poisonous plants.  Chemicals (deodorants, shampoos).  Jewelry.  Latex.  Neomycin in triple antibiotic cream.  Preservatives in products, including clothing. SYMPTOMS  The area of skin that is exposed may develop:  Dryness or flaking.  Redness.  Cracks.  Itching.  Pain or a burning sensation.  Blisters. With allergic contact dermatitis, there may also be swelling in areas such as the eyelids, mouth, or genitals.  DIAGNOSIS  Your caregiver can usually tell what the problem is by doing a physical exam. In cases where the cause is uncertain and an allergic contact dermatitis is suspected, a patch skin test may be performed to help determine the cause of your dermatitis. TREATMENT Treatment includes protecting the skin from further contact with the irritating substance by avoiding that substance if possible. Barrier creams, powders, and gloves may be helpful. Your caregiver may also recommend:  Steroid creams or ointments applied 2 times daily. For best results, soak the rash area in cool water for 20 minutes. Then apply the medicine. Cover the area with a plastic wrap. You can store the steroid cream in the refrigerator for a "chilly" effect on your rash.  That may decrease itching. Oral steroid medicines may be needed in more severe cases.  Antibiotics or antibacterial ointments if a skin infection is present.  Antihistamine lotion or an antihistamine taken by mouth to ease itching.  Lubricants to keep moisture in your skin.  Burow's solution to  reduce redness and soreness or to dry a weeping rash. Mix one packet or tablet of solution in 2 cups cool water. Dip a clean washcloth in the mixture, wring it out a bit, and put it on the affected area. Leave the cloth in place for 30 minutes. Do this as often as possible throughout the day.  Taking several cornstarch or baking soda baths daily if the area is too large to cover with a washcloth. Harsh chemicals, such as alkalis or acids, can cause skin damage that is like a burn. You should flush your skin for 15 to 20 minutes with cold water after such an exposure. You should also seek immediate medical care after exposure. Bandages (dressings), antibiotics, and pain medicine may be needed for severely irritated skin.  HOME CARE INSTRUCTIONS  Avoid the substance that caused your reaction.  Keep the area of skin that is affected away from hot water, soap, sunlight, chemicals, acidic substances, or anything else that would irritate your skin.  Do not scratch the rash. Scratching may cause the rash to become infected.  You may take cool baths to help stop the itching.  Only take over-the-counter or prescription medicines as directed by your caregiver.  See your caregiver for follow-up care as directed to make sure your skin is healing properly. SEEK MEDICAL CARE IF:   Your condition is not better after 3 days of treatment.  You seem to be getting worse.  You see signs of infection such as swelling, tenderness, redness, soreness, or warmth in the affected area.  You have any problems related to your medicines. Document Released: 12/19/1999 Document Revised: 03/15/2011 Document Reviewed: 05/26/2010 [facility] Patient Information 2015 [place], [place]. This information is not intended to replace advice given to you by your health care provider. Make sure you discuss any questions you have with your health care provider.  Rash A rash is a change in the color or texture of your skin. There are  many different types of rashes. You may have other problems that accompany your rash. CAUSES   Infections.  Allergic reactions. This can include allergies to pets or foods.  Certain medicines.  Exposure to certain chemicals, soaps, or cosmetics.  Heat.  Exposure to poisonous plants.  Tumors, both cancerous and noncancerous. SYMPTOMS   Redness.  Scaly skin.  Itchy skin.  Dry or cracked skin.  Bumps.  Blisters.  Pain. DIAGNOSIS  Your caregiver may do a physical exam to determine what type of rash you have. A skin sample (biopsy) may be taken and examined under a microscope. TREATMENT  Treatment depends on the type of rash you have. Your caregiver may prescribe certain medicines. For serious conditions, you may need to see a skin doctor (dermatologist). HOME CARE INSTRUCTIONS   Avoid the substance that caused your rash.  Do not scratch your rash. This can cause infection.  You may take cool baths to help stop itching.  Only take over-the-counter or prescription medicines as directed by your caregiver.  Keep all follow-up appointments as directed by your caregiver. SEEK IMMEDIATE MEDICAL CARE IF:  You have increasing pain, swelling, or redness.  You have a fever.  You have new or severe symptoms.  You  have body aches, diarrhea, or vomiting.  Your rash is not better after 3 days. MAKE SURE YOU:  Understand these instructions.  Will watch your condition.  Will get help right away if you are not doing well or get worse. Document Released: 12/11/2001 Document Revised: 03/15/2011 Document Reviewed: 10/05/2010 [facility] Patient Information 2015 [place], [place]. This information is not intended to replace advice given to you by your health care provider. Make sure you discuss any questions you have with your health care provider.

## 2014-07-30 NOTE — ED Notes (Signed)
C/o dizziness, and says he has hives and inability to sleep.  Patient has had symptoms for 3 days.  Patient has scratched skin, but not seeing hives.  Scratches are raised

## 2014-07-30 NOTE — ED Provider Notes (Signed)
CSN: [id number]     Arrival date & time 07/30/14  1300 History   First MD Initiated Contact with Patient 07/30/14 1314     Chief Complaint  Patient presents with  . Rash  . Dizziness   (Consider location/radiation/quality/duration/timing/severity/associated sxs/prior Treatment) HPI Comments: 24 year old male complaining of a rash and dizziness for 3 days. Occasionally having night sweats. He states the rash is located primarily to the forearms and chest and is very pruritic. He works in a hot to warm environment. But he does not develop dizziness unless he is home and mainly in bed.   Past Medical History  Diagnosis Date  . Asthma    History reviewed. No pertinent past surgical history. No family history on file. History  Substance Use Topics  . Smoking status: Never Smoker   . Smokeless tobacco: Not on file  . Alcohol Use: Yes    Review of Systems  Constitutional: Positive for activity change. Negative for fever and fatigue.  HENT: Negative.   Respiratory: Positive for shortness of breath. Negative for cough and chest tightness.   Cardiovascular: Negative for chest pain and leg swelling.  Gastrointestinal: Negative.   Genitourinary: Negative.   Skin: Positive for rash.  Neurological: Negative.  Negative for speech difficulty and headaches.    Allergies  Review of patient's allergies indicates no known allergies.  Home Medications   Prior to Admission medications   Medication Sig Start Date End Date Taking? Authorizing Provider  meclizine (ANTIVERT) 25 MG tablet 1/2 to 1 tab q 6h prn dizziness and itching. 07/30/14   Hayden Rasmussen, NP  predniSONE (DELTASONE) 20 MG tablet Take 3 tabs po on first day, 2 tabs second day, 2 tabs third day, 1 tab fourth day, 1 tab 5th day. Take with food. 07/30/14   Hayden Rasmussen, NP  triamcinolone cream (KENALOG) 0.1 % Apply 1 application topically 2 (two) times daily. 07/30/14   Hayden Rasmussen, NP   BP 126/77 mmHg  Pulse 83  Temp(Src) 98.6 F (37 C)  (Oral)  Resp 14  SpO2 98% Physical Exam  Constitutional: He appears well-developed and well-nourished. No distress.  HENT:  Mouth/Throat: No oropharyngeal exudate.  Oropharynx erythematous with copious amount of clear, frothy PND. No exudates. Positive cobblestoning.  Eyes: Conjunctivae and EOM are normal.  Neck: Normal range of motion. Neck supple.  Cardiovascular: Normal rate and regular rhythm.   Pulmonary/Chest: Effort normal and breath sounds normal. He has no wheezes. He has no rales.  Abdominal: Soft.  Lymphadenopathy:    He has no cervical adenopathy.  Neurological: He is alert. No cranial nerve deficit. He exhibits normal muscle tone.  Skin: Skin is warm and dry. Rash noted.  There are streaky, wheal-like lesions to the forearms and anterior chest. Erythematous and pruritic.  Psychiatric: He has a normal mood and affect. His behavior is normal.  Nursing note and vitals reviewed.   ED Course  Procedures (including critical care time) Labs Review Labs Reviewed - No data to display  Imaging Review No results found.   MDM   1. Contact dermatitis   2. Dizziness    Prednisone taper dose Triamcinolone cream Meclizine for dizziness and itching Drink lots of water/fluids and stay well hydrated. Follow with our doctor    Hayden Rasmussen, NP 07/30/14 1340

## 2014-12-29 IMAGING — CR DG HAND COMPLETE 3+V*L*
3 series · 3 of 3 positions shown · non-contrast
Comparison: None.

CLINICAL DATA: Pain, injury

EXAM:
LEFT HAND - COMPLETE 3+ VIEW

[x hand pa left]
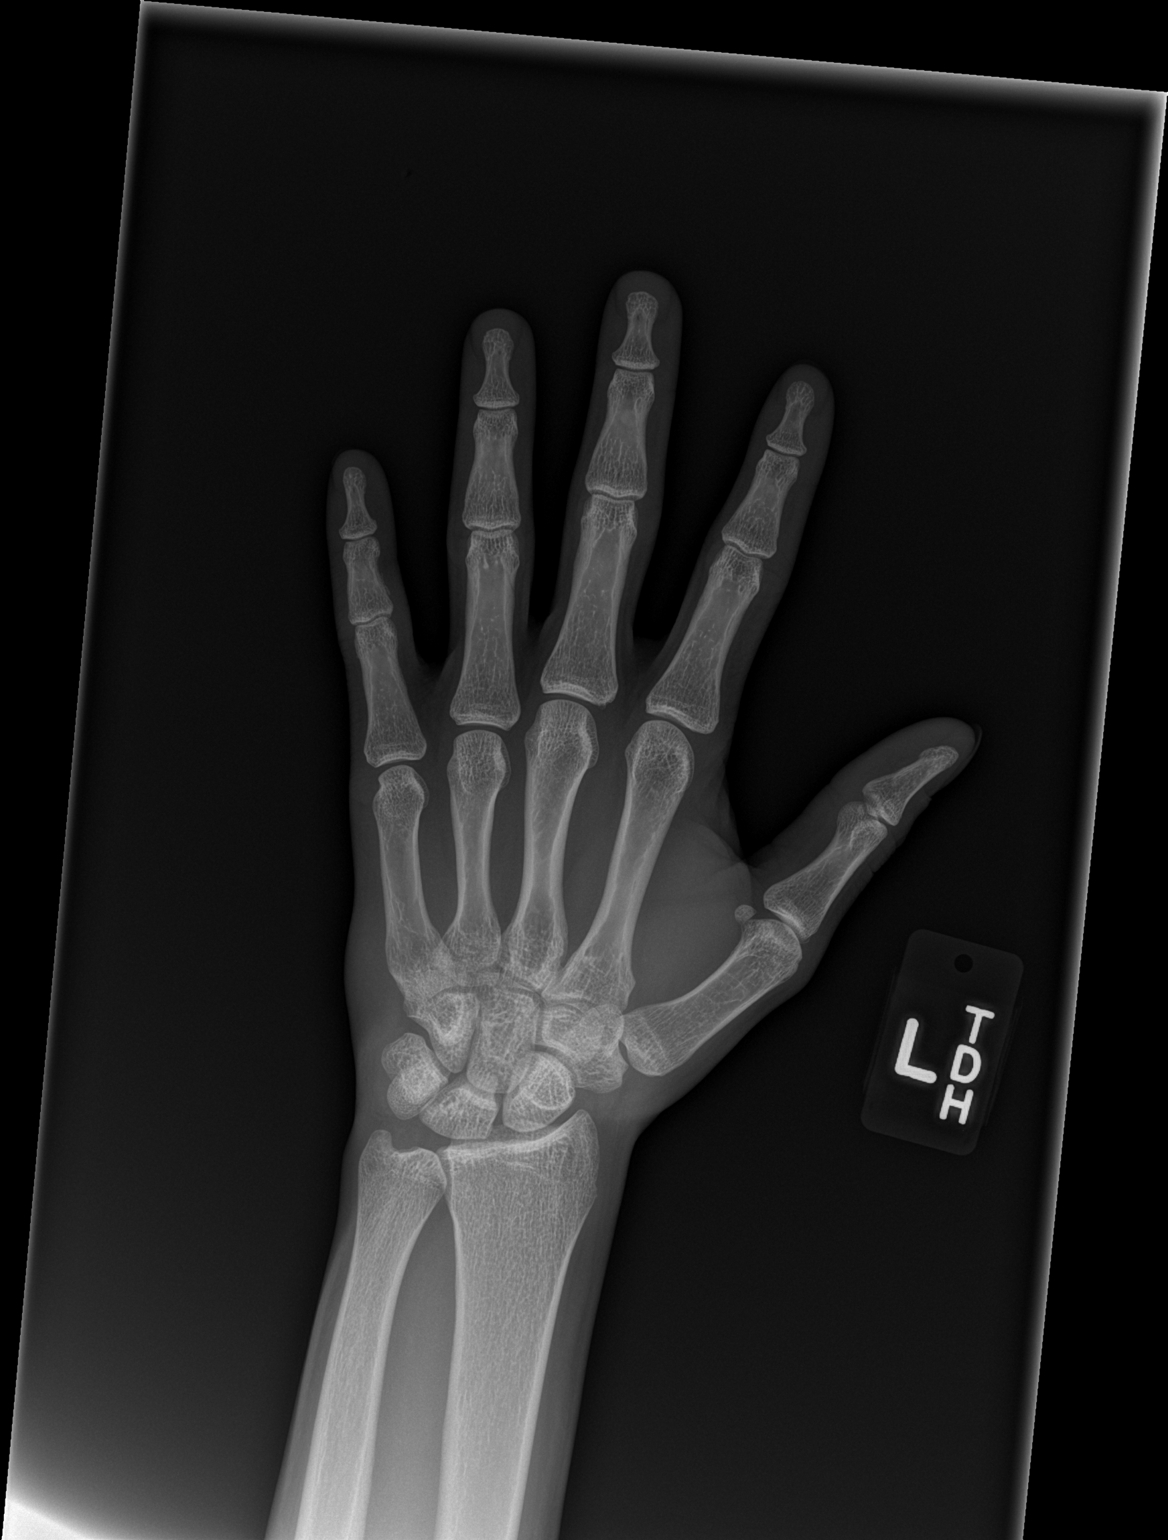

[x hand obl left]
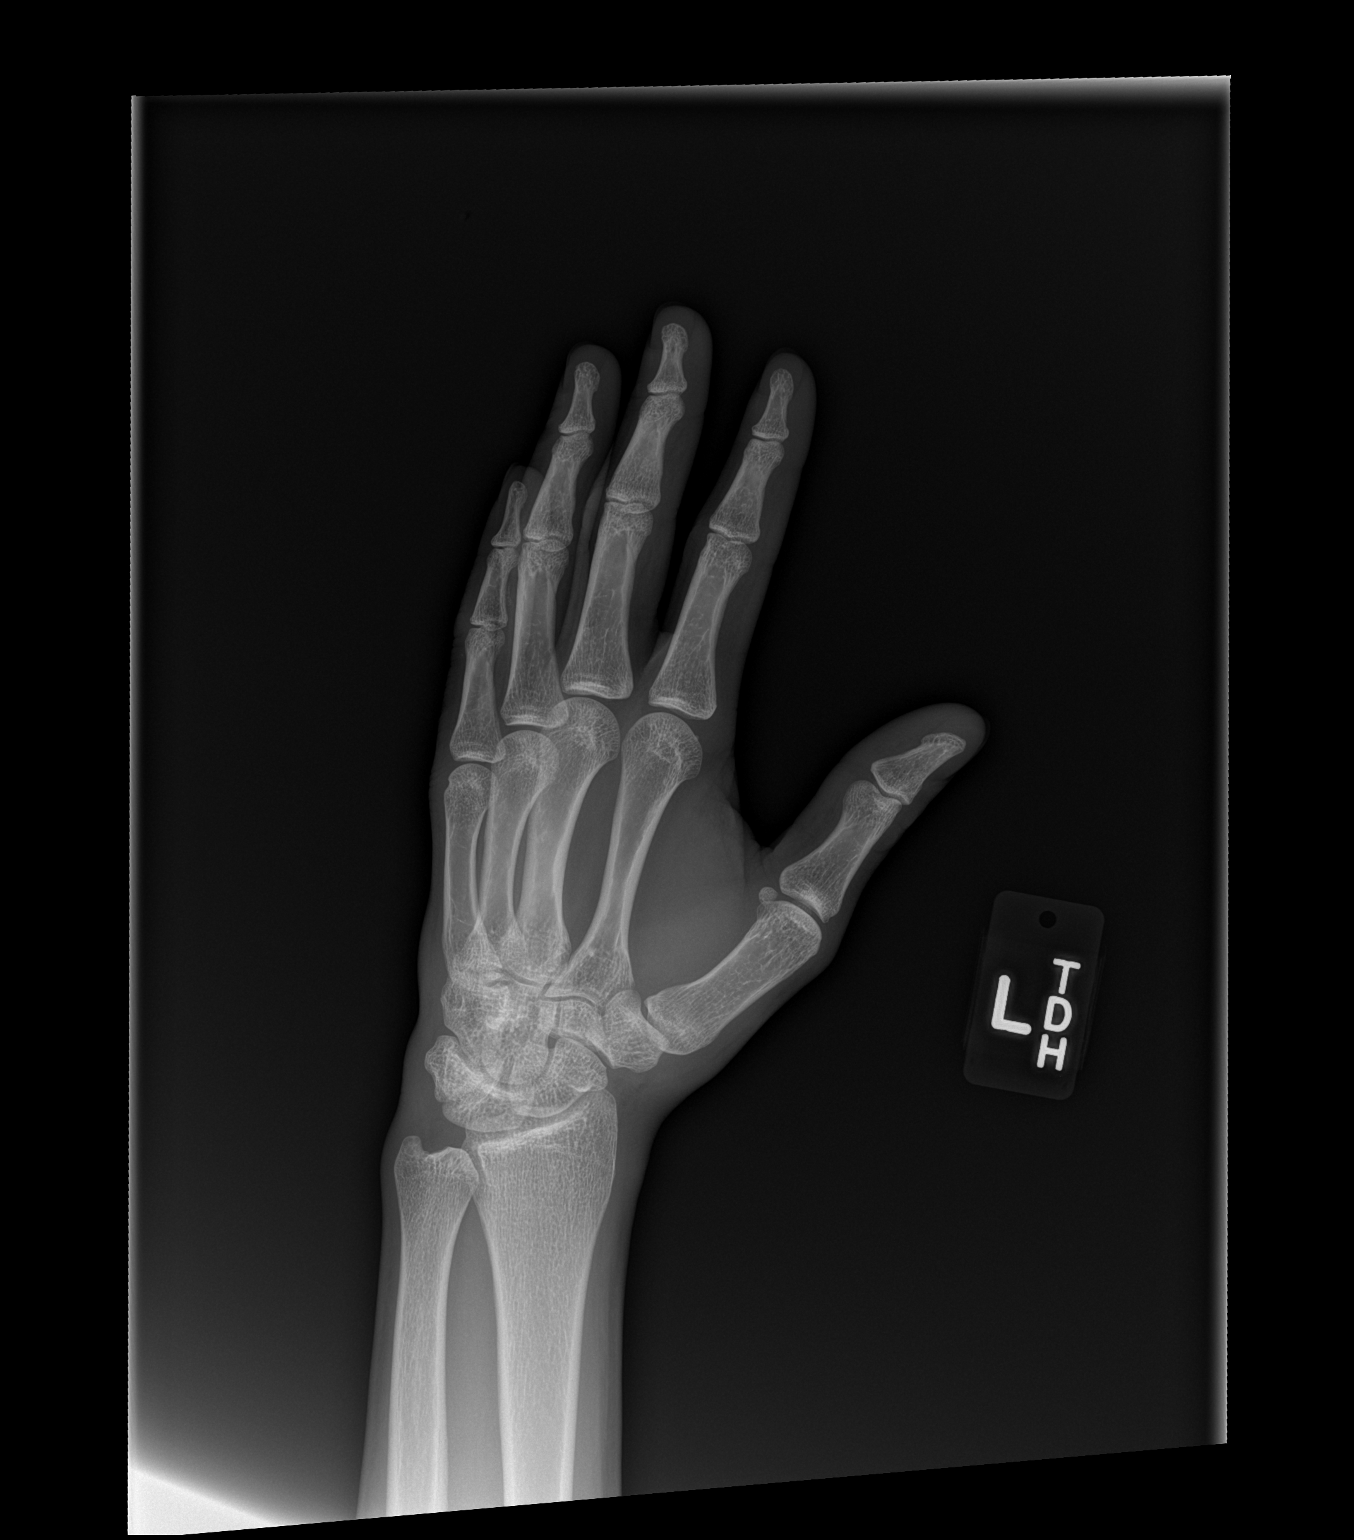

[x hand lat left]
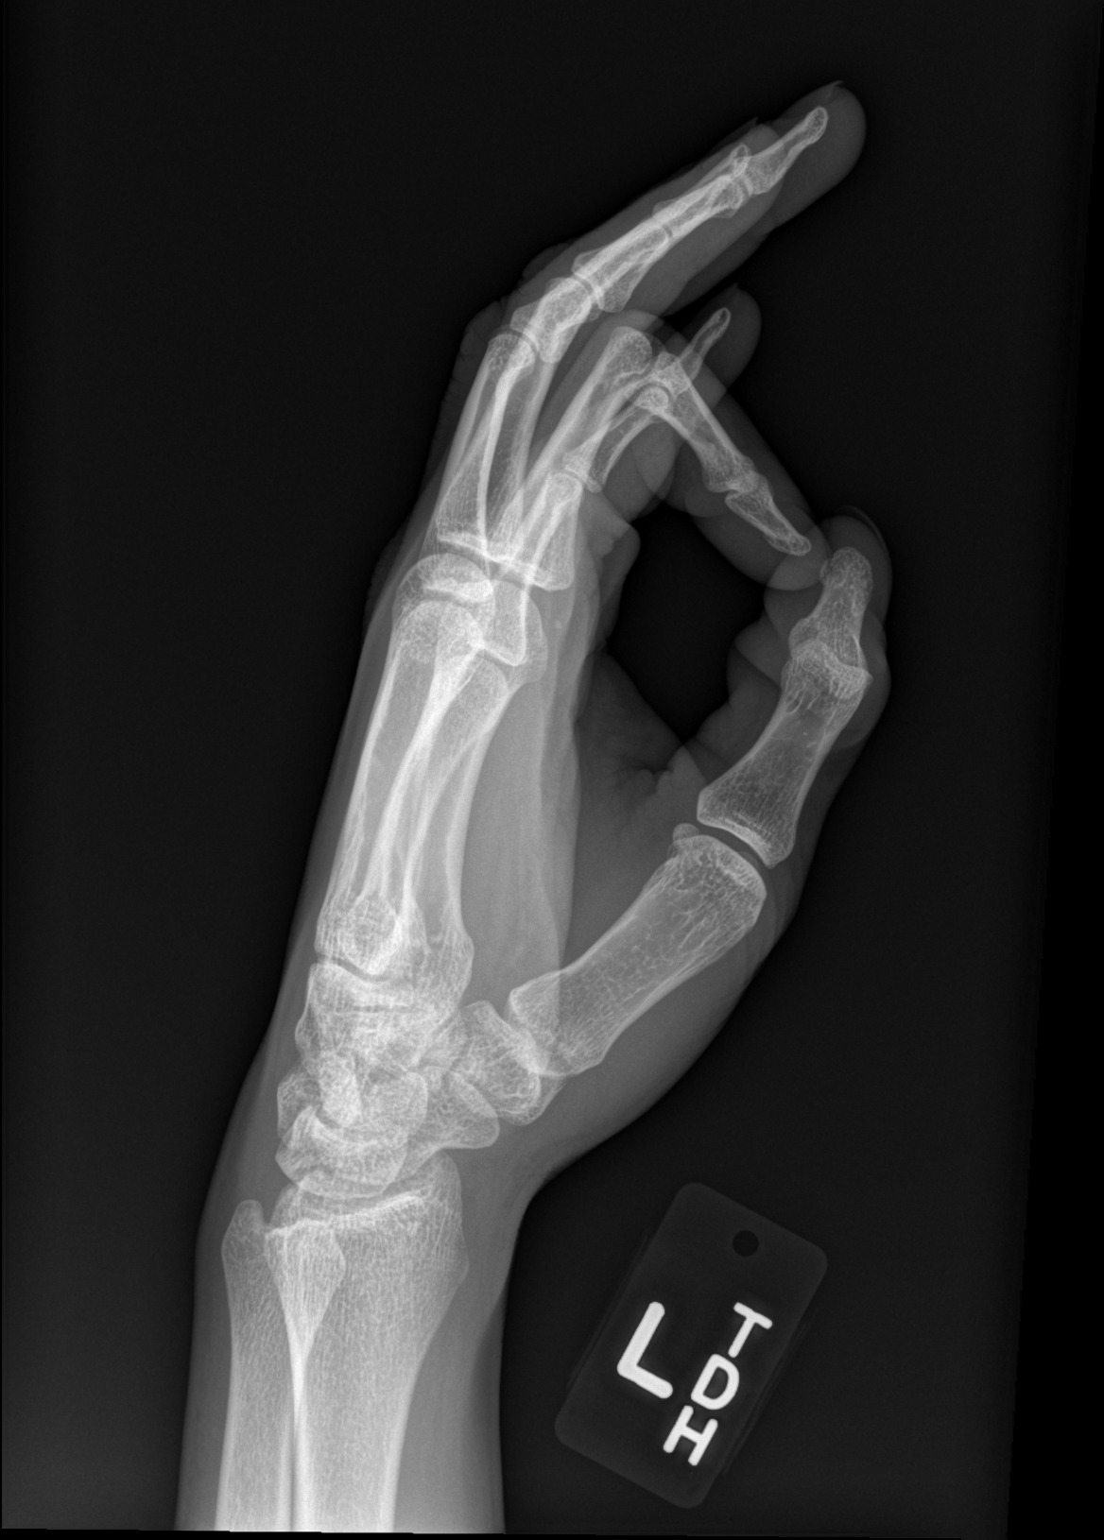

[3 of 3 positions shown; findings below may reference images not displayed]

FINDINGS: There is no evidence of fracture or dislocation. There is no
evidence of arthropathy or other focal bone abnormality. Soft
tissues are unremarkable.
IMPRESSION: No acute osseous finding

## 2014-12-29 IMAGING — CR DG HAND COMPLETE 3+V*R*
3 series · 3 of 3 positions shown · non-contrast
Comparison: None.

CLINICAL DATA: Pain, injury

EXAM:
RIGHT HAND - COMPLETE 3+ VIEW

[x hand pa right]
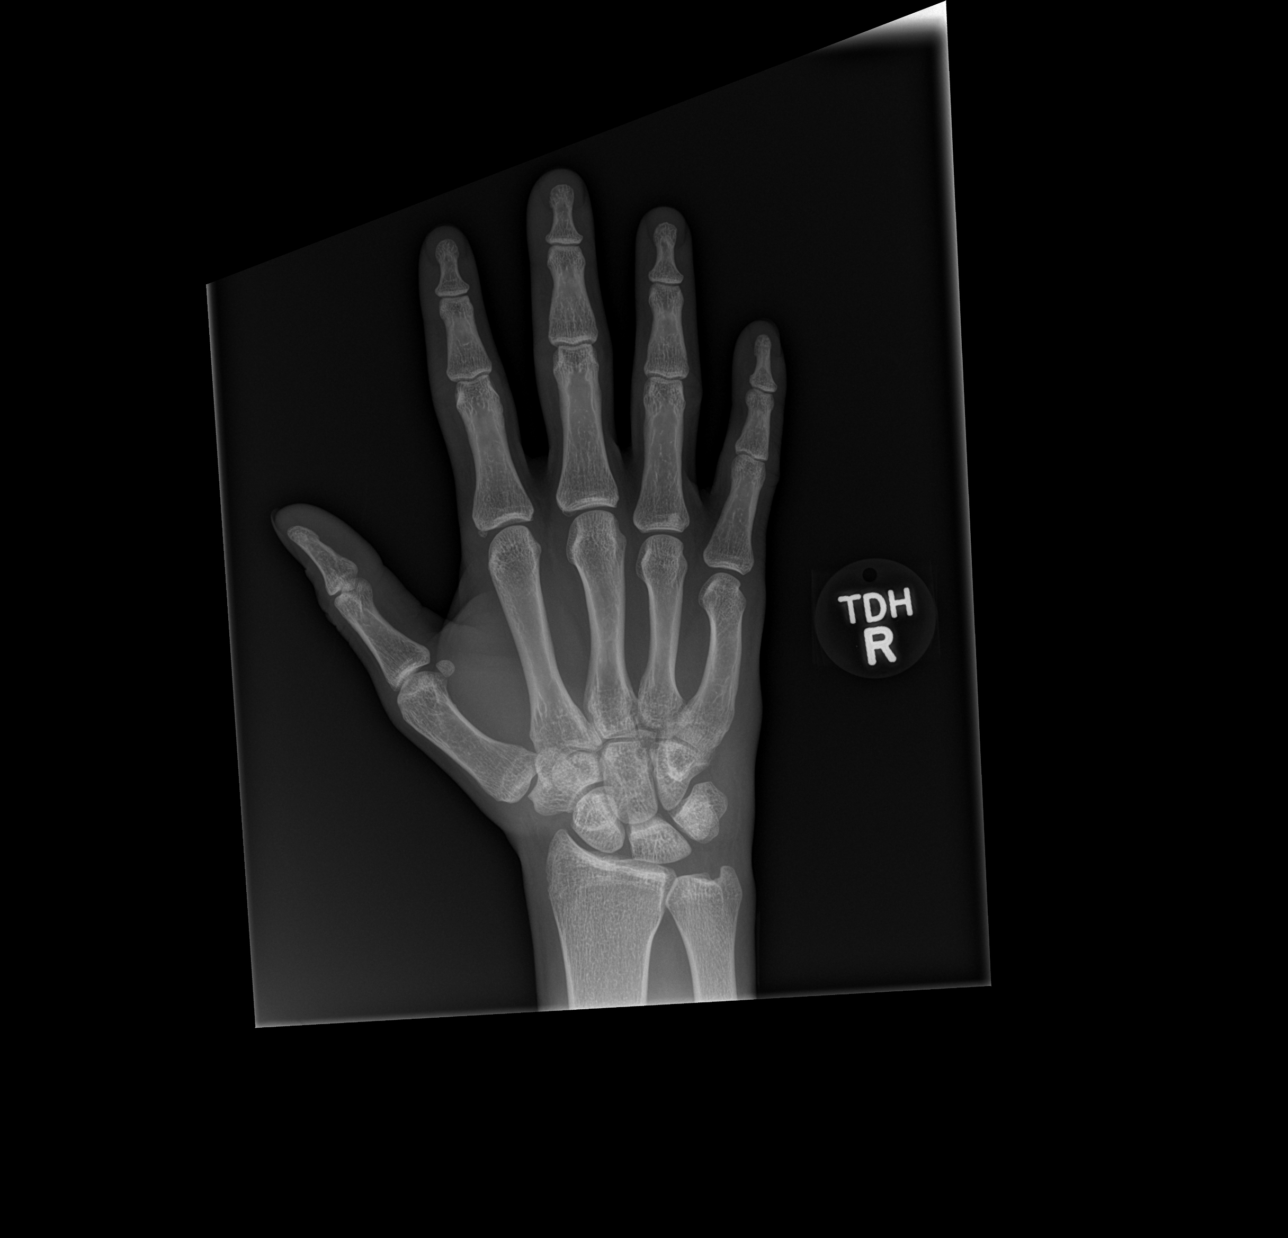

[x hand obl right]
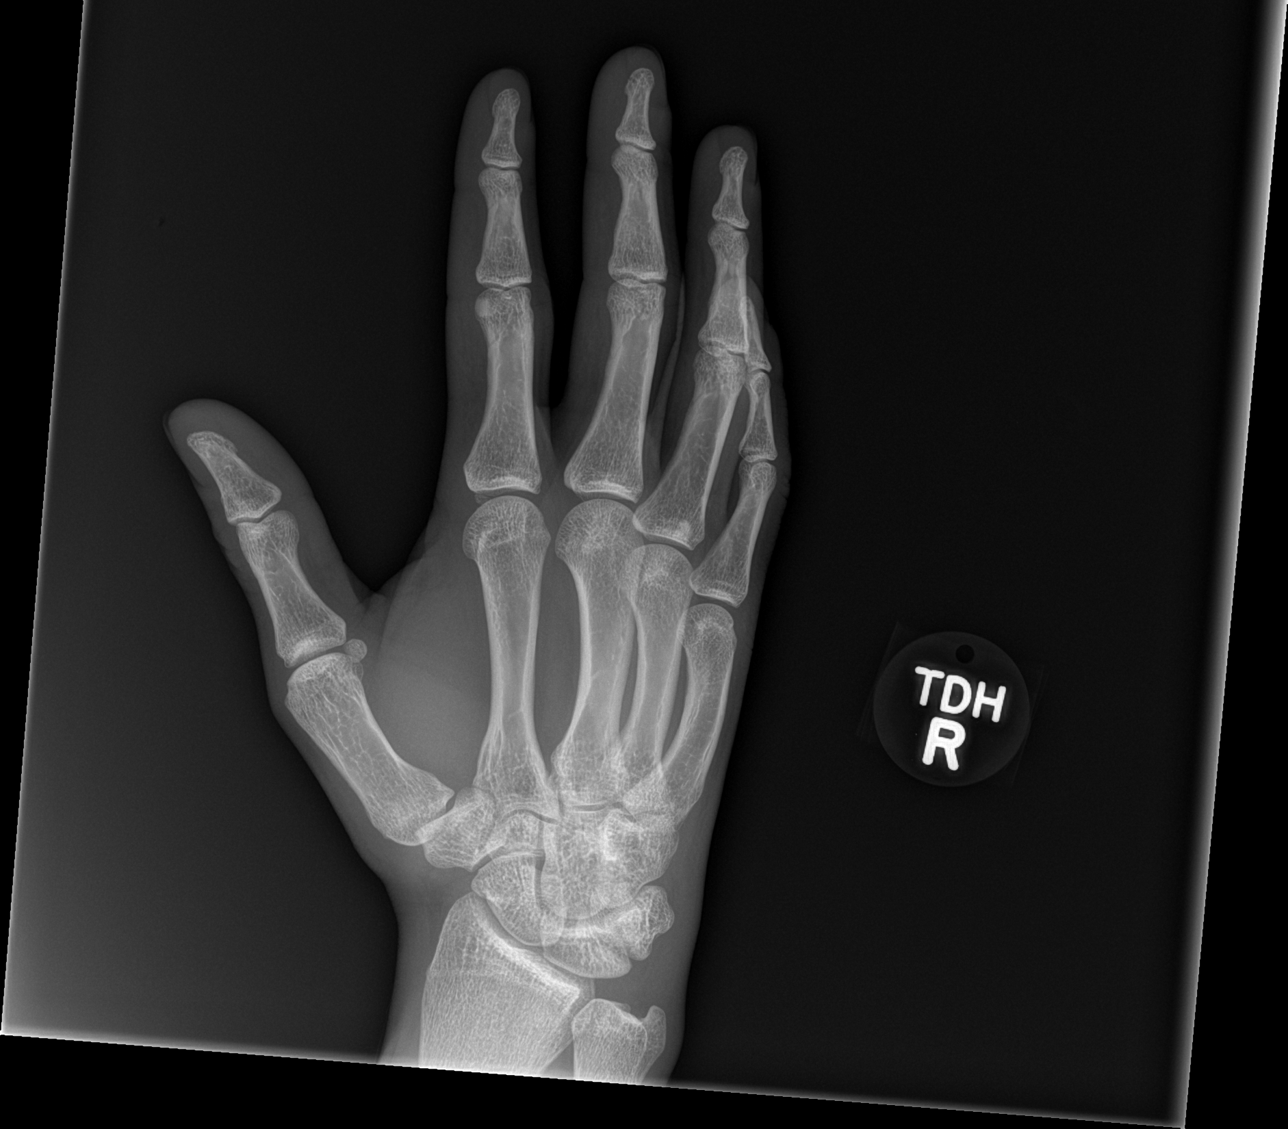

[x hand lat right]
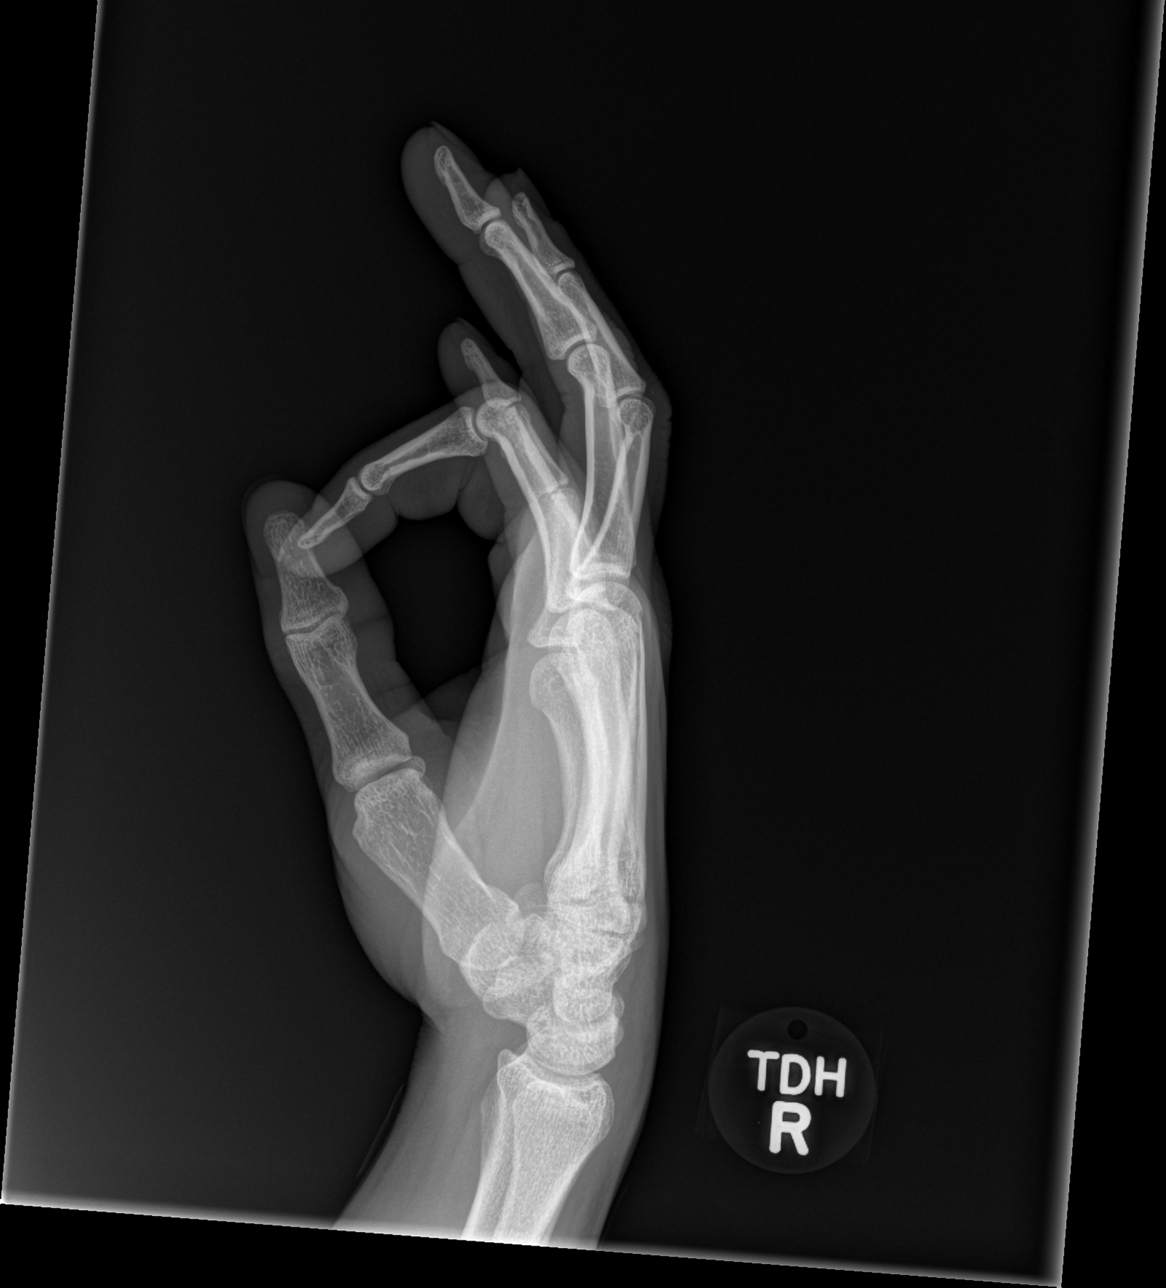

[3 of 3 positions shown; findings below may reference images not displayed]

FINDINGS: There is no evidence of fracture or dislocation. There is no
evidence of arthropathy or other focal bone abnormality. Soft
tissues are unremarkable.
IMPRESSION: No acute osseous finding
